# Patient Record
Sex: Male | Born: 2000 | Race: White | Hispanic: Yes | Marital: Single | Smoking: Never smoker
Health system: Southern US, Community
[De-identification: ages and names within clinical notes are randomized; demographics above are authoritative.]

---
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
## 2021-05-15 ENCOUNTER — Ambulatory Visit (INDEPENDENT_AMBULATORY_CARE_PROVIDER_SITE_OTHER): Payer: Self-pay

## 2021-05-15 ENCOUNTER — Encounter: Payer: Self-pay | Admitting: Emergency Medicine

## 2021-05-15 ENCOUNTER — Other Ambulatory Visit: Payer: Self-pay

## 2021-05-15 ENCOUNTER — Ambulatory Visit
Admission: EM | Admit: 2021-05-15 | Discharge: 2021-05-15 | Disposition: A | Discharge status: Home / Self Care | Payer: Self-pay | Attending: Physician Assistant | Admitting: Physician Assistant

## 2021-05-15 DIAGNOSIS — S8992XA Unspecified injury of left lower leg, initial encounter: Secondary | ICD-10-CM

## 2021-05-15 DIAGNOSIS — M25562 Pain in left knee: Secondary | ICD-10-CM

## 2021-05-15 MED ORDER — PREDNISONE 20 MG PO TABS: 40.0000 mg | ORAL_TABLET | Freq: Every day | ORAL | 0 refills | Status: AC

## 2021-05-15 NOTE — ED Triage Notes (Signed)
Saturday was playing soccer and felt a pop in his left knee. Went to an urgent care where they told him to rest his knee, no imagining was performed. Reports mild swelling after the injury that has improved. Describes the pain as left sided, more towards the inside of the knee, 5/10, worse with movement. Ibuprofen helps the pain.

## 2021-05-15 NOTE — ED Provider Notes (Signed)
[facility]    CSN: [id number] Arrival date & time: 05/15/21  1108      History   Chief Complaint Chief Complaint  Patient presents with   Knee Injury    HPI Melvin Anderson is a 21 y.o. male.   Patient here today for evaluation of pain in his left knee that has been present since he felt a pop in his left knee playing soccer 6 days ago. He reports initially he had issues bending his knee but since injury has slowly been able to bend and straighten fully. He has not had any numbness. He reports that when he squats pain is worse and feels as if it will pop. Pain is primarily present to lower anterior joint line. He has used ibuprofen which has been somewhat helpful  The history is provided by the patient.   History reviewed. No pertinent past medical history.  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 No pertinent surgical history.     Home Medications    Prior to Admission medications   Medication Sig Start Date End Date Taking? Authorizing Provider  predniSONE (DELTASONE) 20 MG tablet Take 2 tablets (40 mg total) by mouth daily with breakfast for 5 days. 05/15/21 05/20/21 Yes Tomi Bamberger, PA-C  Omega-3 Fatty Acids (OMEGA 3 PO) Take by mouth. Reported on 06/13/2015    [provider]  Vitamin D, Cholecalciferol, 1000 UNITS TABS Take 1 tablet by mouth daily. Patient not taking: Reported on 06/13/2015 12/14/13   Marijo File, MD    Family History Family History  Problem Relation Age of Onset   Diabetes Mother    Thyroid disease Mother    Hyperlipidemia Mother    Diabetes Father    Hypertension Father    Diabetes Maternal Grandmother    Hypertension Maternal Grandmother    Kidney disease Paternal Grandmother     Diabetes Paternal Grandmother    Hyperlipidemia Brother    Hyperlipidemia Brother     Social History Social History   Tobacco Use   Smoking status: Never  Substance Use Topics   Alcohol use: No   Drug use: No     Allergies   Patient has no known allergies.   Review of Systems Review of Systems  Constitutional:  Negative for chills and fever.  Eyes:  Negative for discharge and redness.  Musculoskeletal:  Positive for arthralgias. Negative for joint swelling.  Neurological:  Negative for numbness.    Physical Exam Triage Vital Signs ED Triage Vitals [05/15/21 1254]  Enc Vitals Group     BP (!) 158/91     Pulse Rate 66     Resp 16     Temp 98.2 F (36.8 C)     Temp Source Oral     SpO2 98 %     Weight      Height      Head Circumference      Peak Flow      Pain Score 5     Pain Loc      Pain Edu?      Excl. in GC?    No data found.  Updated Vital Signs BP (!) 158/91 (BP Location: Left Arm)    Pulse 66    Temp 98.2 F (36.8  C) (Oral)    Resp 16    SpO2 98%       Physical Exam Vitals and nursing note reviewed.  Constitutional:      General: He is not in acute distress.    Appearance: Normal appearance. He is not ill-appearing.  HENT:     Head: Normocephalic and atraumatic.  Eyes:     Conjunctiva/sclera: Conjunctivae normal.  Cardiovascular:     Rate and Rhythm: Normal rate.  Pulmonary:     Effort: Pulmonary effort is normal.  Musculoskeletal:     Comments: Full ROM of left knee with no TTP to anterior knee, no apparent swelling  Neurological:     Mental Status: He is alert.  Psychiatric:        Mood and Affect: Mood normal.        Behavior: Behavior normal.        Thought Content: Thought content normal.     UC Treatments / Results  Labs (all labs ordered are listed, but only abnormal results are displayed) Labs Reviewed - No data to display  EKG   Radiology DG Knee AP/LAT W/Sunrise Left  Result Date: 05/15/2021 CLINICAL DATA:   Left knee pain after feeling a pop playing soccer last Saturday. EXAM: LEFT KNEE 3 VIEWS COMPARISON:  None. FINDINGS: No acute fracture or dislocation. Small joint effusion. Joint spaces are preserved. Bone mineralization is normal. Well-defined eccentric 3.1 cm sclerotic lesion in the posterior proximal tibial metaphysis consistent with healed nonossifying fibroma. Soft tissues are unremarkable. IMPRESSION: 1. No acute osseous abnormality. 2. Small joint effusion. Electronically Signed   By: Obie Dredge M.D.   On: 05/15/2021 13:38    Procedures Procedures (including critical care time)  Medications Ordered in UC Medications - No data to display  Initial Impression / Assessment and Plan / UC Course  I have reviewed the triage vital signs and the nursing notes.  Pertinent labs & imaging results that were available during my care of the patient were reviewed by me and considered in my medical decision making (see chart for details).    Xray without acute findings. Will trial prednisone burst but did encourage patient to follow up with ortho of no improvement.   Final Clinical Impressions(s) / UC Diagnoses   Final diagnoses:  Injury of left knee, initial encounter   Discharge Instructions   None    ED Prescriptions     Medication Sig Dispense Auth. Provider   predniSONE (DELTASONE) 20 MG tablet Take 2 tablets (40 mg total) by mouth daily with breakfast for 5 days. 10 tablet Tomi Bamberger, PA-C      PDMP not reviewed this encounter.   Tomi Bamberger, PA-C 05/15/21 1422

## 2021-12-06 ENCOUNTER — Emergency Department (HOSPITAL_COMMUNITY): Payer: BC Managed Care – PPO

## 2021-12-06 ENCOUNTER — Encounter (HOSPITAL_COMMUNITY): Payer: Self-pay

## 2021-12-06 ENCOUNTER — Emergency Department (HOSPITAL_COMMUNITY)
Admission: EM | Admit: 2021-12-06 | Discharge: 2021-12-06 | Disposition: A | Discharge status: Home / Self Care | Payer: BC Managed Care – PPO | Attending: Emergency Medicine | Admitting: Emergency Medicine

## 2021-12-06 DIAGNOSIS — R040 Epistaxis: Secondary | ICD-10-CM | POA: Diagnosis not present

## 2021-12-06 DIAGNOSIS — S0121XA Laceration without foreign body of nose, initial encounter: Secondary | ICD-10-CM | POA: Diagnosis not present

## 2021-12-06 DIAGNOSIS — S022XXA Fracture of nasal bones, initial encounter for closed fracture: Secondary | ICD-10-CM | POA: Insufficient documentation

## 2021-12-06 DIAGNOSIS — S0992XA Unspecified injury of nose, initial encounter: Secondary | ICD-10-CM | POA: Diagnosis present

## 2021-12-06 MED ORDER — CEPHALEXIN 500 MG PO CAPS
500.0000 mg | ORAL_CAPSULE | Freq: Once | ORAL | Status: AC
  Administered 2021-12-06: 500 mg via ORAL
  Filled 2021-12-06: qty 1

## 2021-12-06 MED ORDER — LIDOCAINE-EPINEPHRINE-TETRACAINE (LET) TOPICAL GEL
3.0000 mL | Freq: Once | TOPICAL | Status: AC
Start: 2021-12-06 — End: 2021-12-06
  Administered 2021-12-06: 3 mL via TOPICAL
  Filled 2021-12-06: qty 3

## 2021-12-06 MED ORDER — OXYMETAZOLINE HCL 0.05 % NA SOLN
1.0000 | Freq: Once | NASAL | Status: AC
  Administered 2021-12-06: 1 via NASAL
  Filled 2021-12-06: qty 30

## 2021-12-06 MED ORDER — HYDROCODONE-ACETAMINOPHEN 5-325 MG PO TABS
1.0000 | ORAL_TABLET | Freq: Once | ORAL | Status: AC
  Administered 2021-12-06: 1 via ORAL
  Filled 2021-12-06: qty 1

## 2021-12-06 MED ORDER — CEPHALEXIN 500 MG PO CAPS: 500.0000 mg | ORAL_CAPSULE | Freq: Two times a day (BID) | ORAL | 0 refills | Status: DC

## 2021-12-06 NOTE — ED Notes (Signed)
Suture cart/tray at the bedside
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
## 2022-07-05 IMAGING — DX DG KNEE AP/LAT W/ SUNRISE*L*
4 series · 4 of 4 positions shown · non-contrast
Comparison: None.

CLINICAL DATA: Left knee pain after feeling a pop playing soccer
last [REDACTED].

EXAM:
LEFT KNEE 3 VIEWS

[knee ap]
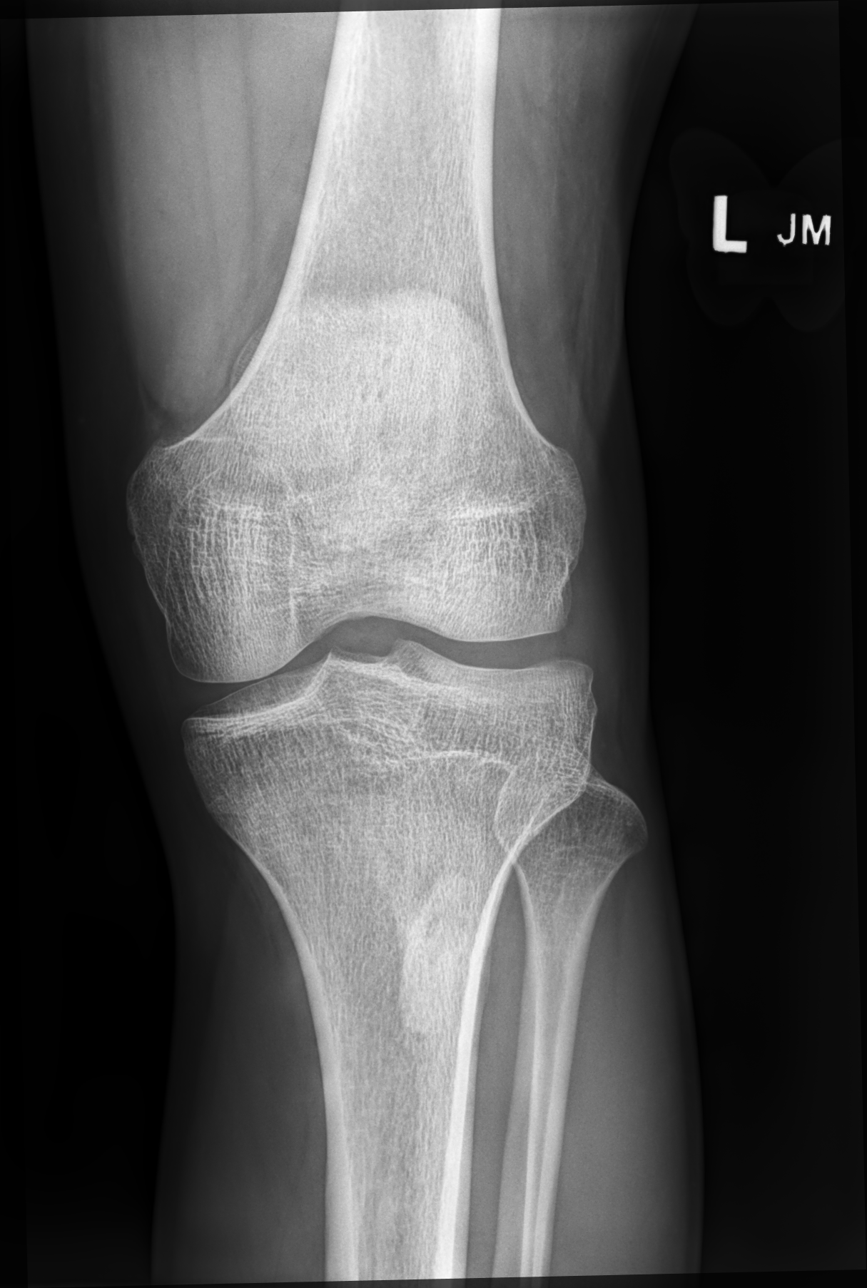

[knee lmo]
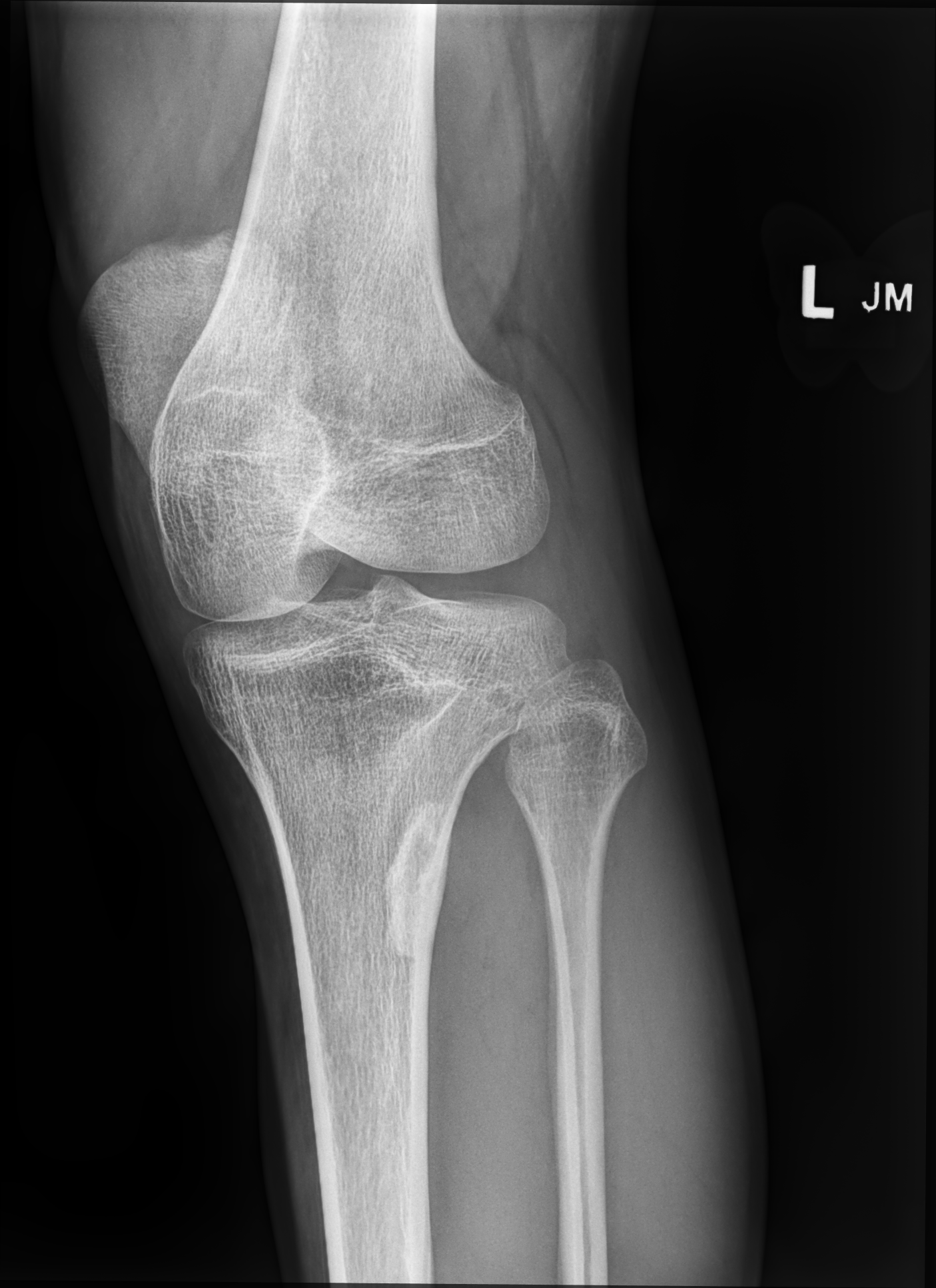

[knee lat]
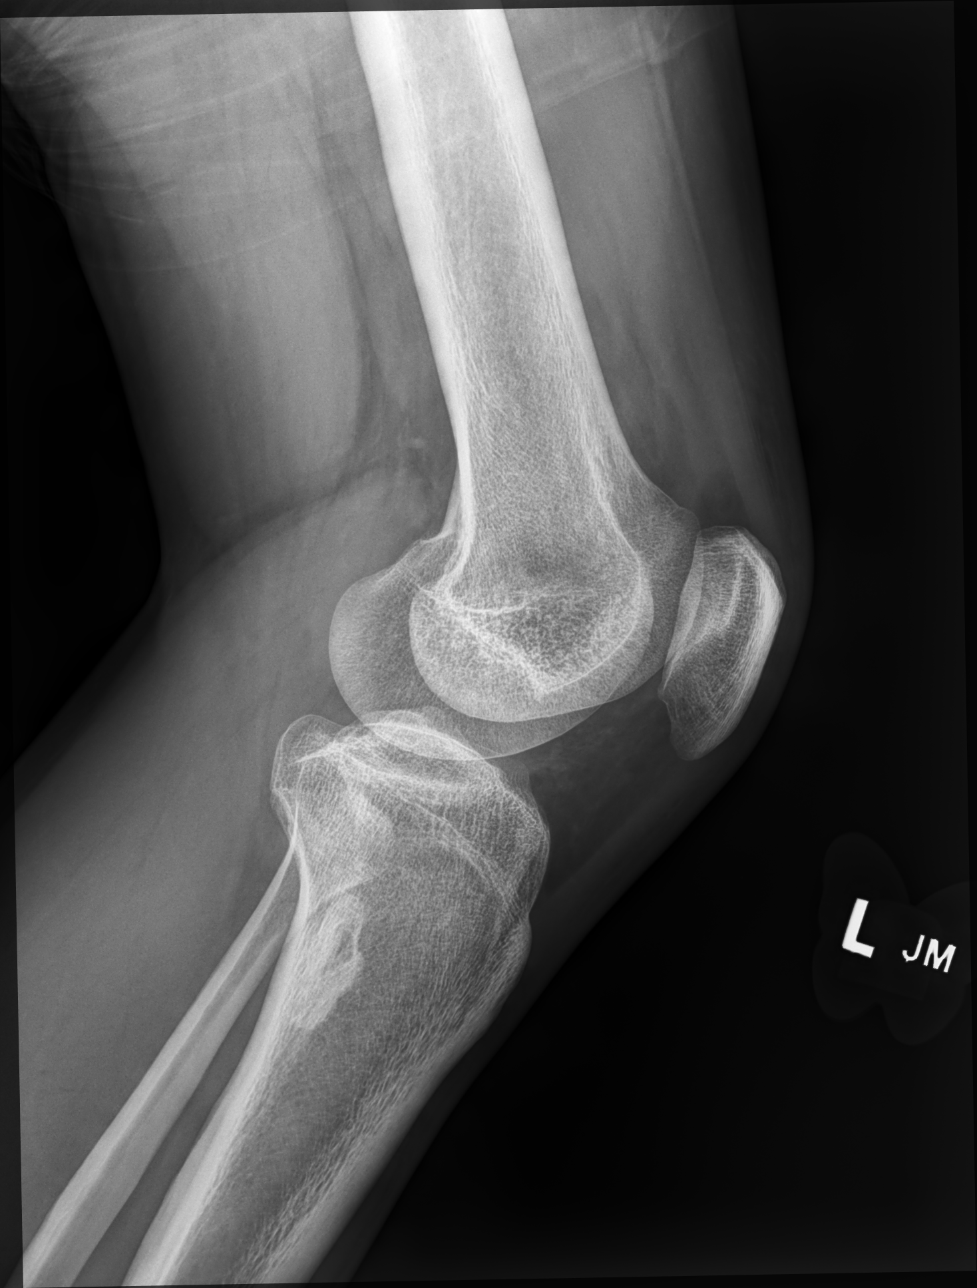

[patella tangential]
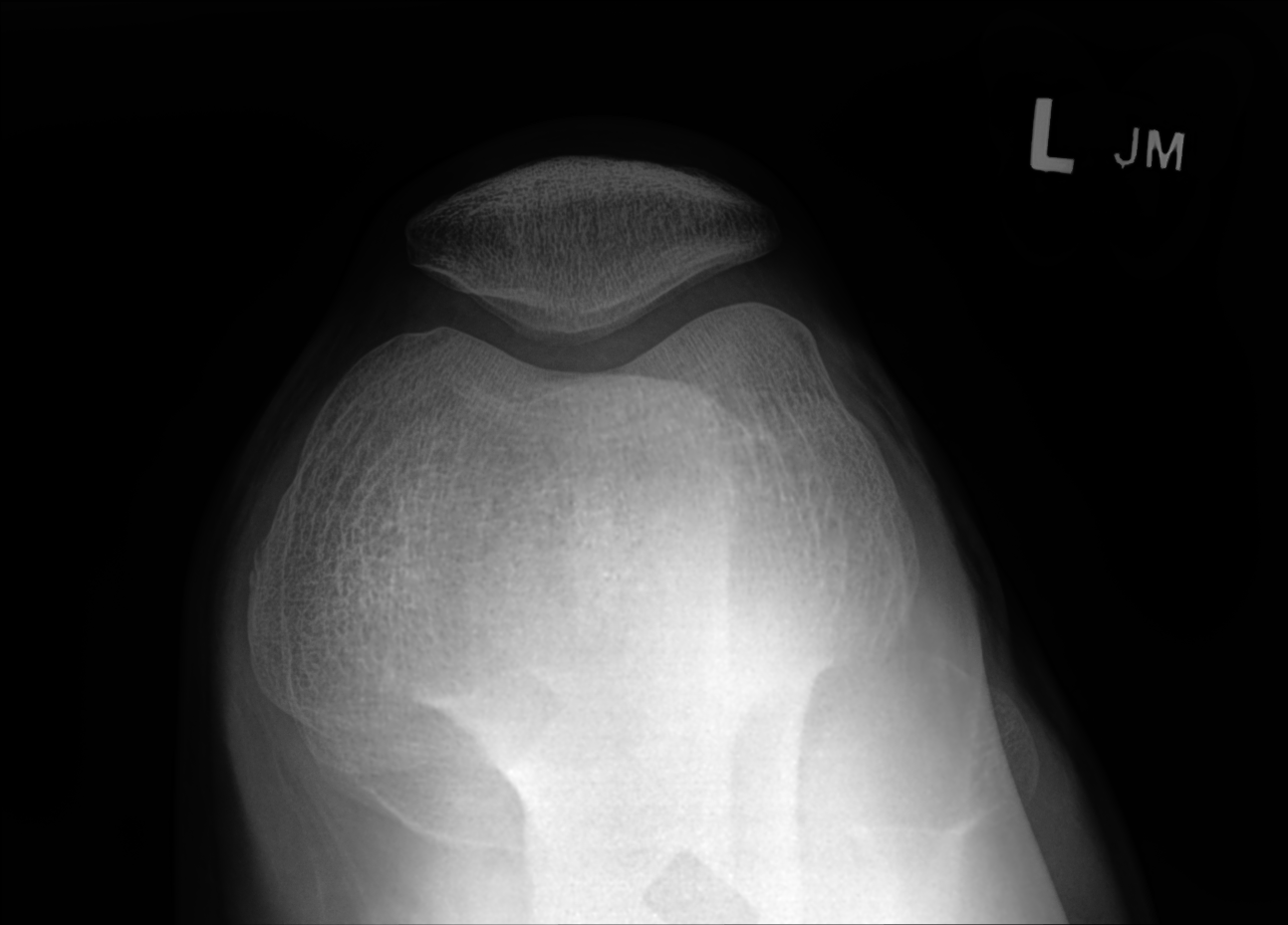

[4 of 4 positions shown; findings below may reference images not displayed]

FINDINGS: No acute fracture or dislocation. Small joint effusion. Joint spaces
are preserved. Bone mineralization is normal. Well-defined eccentric
3.1 cm sclerotic lesion in the posterior proximal tibial metaphysis
consistent with healed nonossifying fibroma. Soft tissues are
unremarkable.
IMPRESSION: 1. No acute osseous abnormality.
2. Small joint effusion.

## 2023-06-16 ENCOUNTER — Ambulatory Visit (INDEPENDENT_AMBULATORY_CARE_PROVIDER_SITE_OTHER)

## 2023-06-16 ENCOUNTER — Encounter (HOSPITAL_COMMUNITY): Payer: Self-pay

## 2023-06-16 ENCOUNTER — Ambulatory Visit (HOSPITAL_COMMUNITY)
Admission: EM | Admit: 2023-06-16 | Discharge: 2023-06-16 | Disposition: A | Discharge status: Home / Self Care | Attending: Family Medicine | Admitting: Family Medicine

## 2023-06-16 DIAGNOSIS — M79671 Pain in right foot: Secondary | ICD-10-CM

## 2023-06-16 NOTE — Discharge Instructions (Signed)
 You were seen today for foot pain.  Your xray appears normal.  If the radiologist sees something concerning we will call to notify you.  I recommend you make an appointment with [facility] for further evaluation.  I have placed this referral for you today.  You may call [phone] to make an appointment.  Continue motrin as needed for pain in the mean time.

## 2023-06-16 NOTE — ED Provider Notes (Signed)
 [facility]    CSN: [id number] Arrival date & time: 06/16/23  1048      History   Chief Complaint Chief Complaint  Patient presents with   Foot Pain    HPI Melvin Anderson is a 23 y.o. male.    Foot Pain  Patient is here for right foot pain x 3 weeks.  No injury. It started one day while at work.  He has pain to the top of the right foot, near the ankle joint with walking.  Motrin does help ease the pain for several hrs.  He  also notes pain to the top of the foot if he applies pressure to the bottom of the foot near the arch.  No swelling noted.        History reviewed. No pertinent past medical history.  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 No pertinent surgical history.     Home Medications    Prior to Admission medications   Medication Sig Start Date End Date Taking? Authorizing Provider  Omega-3 Fatty Acids (OMEGA 3 PO) Take by mouth. Reported on 06/13/2015    [provider]    Family History Family History  Problem Relation Age of Onset   Diabetes Mother    Thyroid disease Mother    Hyperlipidemia Mother    Diabetes Father    Hypertension Father    Diabetes Maternal Grandmother    Hypertension Maternal Grandmother    Kidney disease Paternal Grandmother    Diabetes Paternal Grandmother    Hyperlipidemia Brother    Hyperlipidemia Brother     Social History Social History   Tobacco Use   Smoking status: Never  Substance Use Topics   Alcohol use: No   Drug use: No     Allergies   Patient has no known allergies.   Review of Systems Review of Systems  HENT: Negative.    Respiratory: Negative.    Cardiovascular: Negative.   Gastrointestinal: Negative.   Genitourinary:  Negative.      Physical Exam Triage Vital Signs ED Triage Vitals [06/16/23 1139]  Encounter Vitals Group     BP (!) 154/101     Systolic BP Percentile      Diastolic BP Percentile      Pulse Rate 65     Resp 18     Temp 98.7 F (37.1 C)     Temp Source Oral     SpO2 97 %     Weight      Height      Head Circumference      Peak Flow      Pain Score 0     Pain Loc      Pain Education      Exclude from Growth Chart    No data found.  Updated Vital Signs BP (!) 154/101 (BP Location: Left Arm)   Pulse 65   Temp 98.7 F (37.1 C) (Oral)   Resp 18   SpO2 97%   Visual Acuity Right Eye Distance:   Left Eye Distance:   Bilateral Distance:    Right Eye Near:   Left Eye Near:    Bilateral Near:     Physical Exam Constitutional:      Appearance: Normal appearance. He is normal  weight.  Musculoskeletal:        General: No swelling, tenderness or deformity. Normal range of motion.     Comments: Right foot appear normal.  Could not illicit any TTP today  Skin:    General: Skin is warm.  Neurological:     General: No focal deficit present.     Mental Status: He is alert.  Psychiatric:        Mood and Affect: Mood normal.      UC Treatments / Results  Labs (all labs ordered are listed, but only abnormal results are displayed) Labs Reviewed - No data to display  EKG   Radiology No results found.  Procedures Procedures (including critical care time)  Medications Ordered in UC Medications - No data to display  Initial Impression / Assessment and Plan / UC Course  I have reviewed the triage vital signs and the nursing notes.  Pertinent labs & imaging results that were available during my care of the patient were reviewed by me and considered in my medical decision making (see chart for details).  Final Clinical Impressions(s) / UC Diagnoses   Final diagnoses:  Right foot pain     Discharge Instructions      You were seen today for foot pain.   Your xray appears normal.  If the radiologist sees something concerning we will call to notify you.  I recommend you make an appointment with [facility] for further evaluation.  I have placed this referral for you today.  You may call [phone] to make an appointment.  Continue motrin as needed for pain in the mean time.     ED Prescriptions   None    PDMP not reviewed this encounter.   Jannifer Franklin, MD 06/16/23 [phone]

## 2023-06-16 NOTE — ED Triage Notes (Signed)
 Pt c/o top of rt foot pain x3 wks. Denies injury. States taking ibuprofen with relief.

## 2023-06-21 ENCOUNTER — Encounter: Payer: Self-pay | Admitting: Podiatry

## 2023-06-21 ENCOUNTER — Ambulatory Visit: Admitting: Podiatry

## 2023-06-21 ENCOUNTER — Ambulatory Visit (INDEPENDENT_AMBULATORY_CARE_PROVIDER_SITE_OTHER)

## 2023-06-21 DIAGNOSIS — G8929 Other chronic pain: Secondary | ICD-10-CM

## 2023-06-21 DIAGNOSIS — M79671 Pain in right foot: Secondary | ICD-10-CM | POA: Diagnosis not present

## 2023-06-21 DIAGNOSIS — M7751 Other enthesopathy of right foot: Secondary | ICD-10-CM | POA: Diagnosis not present

## 2023-06-21 MED ORDER — DICLOFENAC SODIUM 75 MG PO TBEC: 75.0000 mg | DELAYED_RELEASE_TABLET | Freq: Two times a day (BID) | ORAL | 2 refills | Status: DC

## 2023-06-21 MED ORDER — TRIAMCINOLONE ACETONIDE 10 MG/ML IJ SUSP
10.0000 mg | Freq: Once | INTRAMUSCULAR | Status: AC
  Administered 2023-06-21: 10 mg via INTRA_ARTICULAR

## 2023-06-22 NOTE — Progress Notes (Signed)
 Subjective:   Patient ID: Melvin Anderson, male   DOB: 23 y.o.   MRN: [id number]   HPI Patient states he is developed a lot of pain in his right ankle over the last 3 weeks and its gradually become harder for him to walk with.  It is in the top of the foot and he does not remember injury.  Patient does not smoke likes to be active   Review of Systems  All other systems reviewed and are negative.       Objective:  Physical Exam Vitals and nursing note reviewed.  Constitutional:      Appearance: He is well-developed.  Pulmonary:     Effort: Pulmonary effort is normal.  Musculoskeletal:        General: Normal range of motion.  Skin:    General: Skin is warm.  Neurological:     Mental Status: He is alert.     Neurovascular status intact muscle strength found to be adequate range of motion within normal limits with patient found to have inflammation pain of the dorsal ankle right no indication of tendon dysfunction or weakness.  Good digital perfusion well-oriented x 3     Assessment:  Inflammatory capsulitis of the dorsal ankle right with fluid buildup around the joint surface with probable subtle injury pattern 3 weeks duration     Plan:  H&P x-rays reviewed sterile prep and injected the dorsal ankle 3 mg Kenalog 5 mg Xylocaine applied sterile dressing instructed on supportive shoes and reappoint as needed  X-rays indicate that there is no signs of fracture no signs of impingement injury or other pathology right ankle

## 2023-07-15 ENCOUNTER — Encounter: Payer: Self-pay | Admitting: Podiatry

## 2023-07-15 ENCOUNTER — Ambulatory Visit: Admitting: Podiatry

## 2023-07-15 DIAGNOSIS — M779 Enthesopathy, unspecified: Secondary | ICD-10-CM

## 2023-07-15 MED ORDER — TRIAMCINOLONE ACETONIDE 10 MG/ML IJ SUSP
10.0000 mg | Freq: Once | INTRAMUSCULAR | Status: AC
  Administered 2023-07-15: 10 mg via INTRA_ARTICULAR

## 2023-07-16 NOTE — Progress Notes (Signed)
 Subjective:   Patient ID: Melvin Anderson, male   DOB: 23 y.o.   MRN: [id number]   HPI Patient presents stating the right ankle feels good but now it seems like it is more on top of the right foot that is been sore with activity   ROS      Objective:  Physical Exam  Neurovascular status intact muscle strength adequate patient has reduced discomfort in the ankle joint right subtalar joint right with reduced edema but does have discomfort into the lateral dorsal foot complex     Assessment:  Probability for compensatory tendinitis that is been bothersome for the patient and patient needs to be active     Plan:  H&P done sterile prep injected the tendon complex dorsal lateral right 3 mg dexamethasone Kenalog 5 mg Xylocaine advised on ice and I placed on oral anti-inflammatory diclofenac.  Reappoint for [name] to recheck

## 2023-08-24 ENCOUNTER — Other Ambulatory Visit: Payer: Self-pay | Admitting: Podiatry

## 2023-10-07 ENCOUNTER — Encounter: Payer: Self-pay | Admitting: Podiatry

## 2023-10-07 ENCOUNTER — Ambulatory Visit: Admitting: Podiatry

## 2023-10-07 VITALS — Ht 68.0 in | Wt 165.0 lb

## 2023-10-07 DIAGNOSIS — M779 Enthesopathy, unspecified: Secondary | ICD-10-CM

## 2023-10-07 DIAGNOSIS — M7751 Other enthesopathy of right foot: Secondary | ICD-10-CM | POA: Diagnosis not present

## 2023-10-07 MED ORDER — DICLOFENAC SODIUM 75 MG PO TBEC: 75.0000 mg | DELAYED_RELEASE_TABLET | Freq: Two times a day (BID) | ORAL | 2 refills | Status: DC

## 2023-10-07 NOTE — Progress Notes (Signed)
 Subjective:   Patient ID: Melvin Anderson, male   DOB: 23 y.o.   MRN: [id number]   HPI Patient states he has had discomfort in his right ankle again but it is hard to describe it does not seem as bad as it was before and he did get some new work boots and those seem to be helping   ROS      Objective:  Physical Exam  Neurovascular status intact range of motion was adequate no equinus noted with mild discomfort with forced dorsiflexion of the foot.  No area I noted acute at the current time     Assessment:  Appears to be more inflammatory than it is anything else and does not appear to be related to any kind of arthritic issue     Plan:  H&P reviewed and I did discuss the possibilities for MRI if symptoms persist but at this point working to try to hold off and just use conservative treatments and I went ahead and I am placing back on an anti-inflammatory with education given today concerning anti-inflammatory and patient will be seen back for us  to recheck again and may require again more advanced studies if symptoms were to persist

## 2023-10-12 ENCOUNTER — Other Ambulatory Visit: Payer: Self-pay

## 2023-10-12 ENCOUNTER — Encounter: Payer: Self-pay | Admitting: Podiatry

## 2023-10-12 MED ORDER — DICLOFENAC SODIUM 75 MG PO TBEC
75.0000 mg | DELAYED_RELEASE_TABLET | Freq: Two times a day (BID) | ORAL | 2 refills | Status: AC
Start: 2023-10-12 — End: ?
